# Patient Record
Sex: Male | Born: 1955 | Race: White | Hispanic: No | Smoking: Former smoker
Health system: Southern US, Community
[De-identification: ages and names within clinical notes are randomized; demographics above are authoritative.]

## PROBLEM LIST (undated history)

## (undated) DIAGNOSIS — I10 Essential (primary) hypertension: Secondary | ICD-10-CM

## (undated) DIAGNOSIS — N529 Male erectile dysfunction, unspecified: Secondary | ICD-10-CM

## (undated) DIAGNOSIS — G4730 Sleep apnea, unspecified: Secondary | ICD-10-CM

## (undated) DIAGNOSIS — F32A Depression, unspecified: Secondary | ICD-10-CM

## (undated) DIAGNOSIS — F419 Anxiety disorder, unspecified: Secondary | ICD-10-CM

## (undated) DIAGNOSIS — I499 Cardiac arrhythmia, unspecified: Secondary | ICD-10-CM

## (undated) DIAGNOSIS — F329 Major depressive disorder, single episode, unspecified: Secondary | ICD-10-CM

## (undated) HISTORY — PX: NASAL SEPTUM SURGERY: SHX37

## (undated) HISTORY — PX: ATRIAL FIBRILLATION ABLATION: EP1191

---

## 2010-06-21 ENCOUNTER — Emergency Department: Payer: Self-pay | Admitting: Emergency Medicine

## 2013-11-22 ENCOUNTER — Ambulatory Visit: Payer: Self-pay | Admitting: Unknown Physician Specialty

## 2014-03-07 ENCOUNTER — Ambulatory Visit: Payer: Self-pay | Admitting: Internal Medicine

## 2014-03-28 ENCOUNTER — Ambulatory Visit: Payer: Self-pay | Admitting: Internal Medicine

## 2014-06-04 ENCOUNTER — Ambulatory Visit: Payer: Self-pay | Admitting: Internal Medicine

## 2015-08-22 IMAGING — CR ORBITS FOR FOREIGN BODY - 2 VIEW
1 series · 2 of 2 positions shown · non-contrast
Comparison: None.

CLINICAL DATA: Metal working/exposure; clearance prior to MRI

EXAM:
ORBITS FOR FOREIGN BODY - 2 VIEW

[Series 1: w waters pa · 0.14mm/px · 2 of 2 slices shown]
[im 1/2]
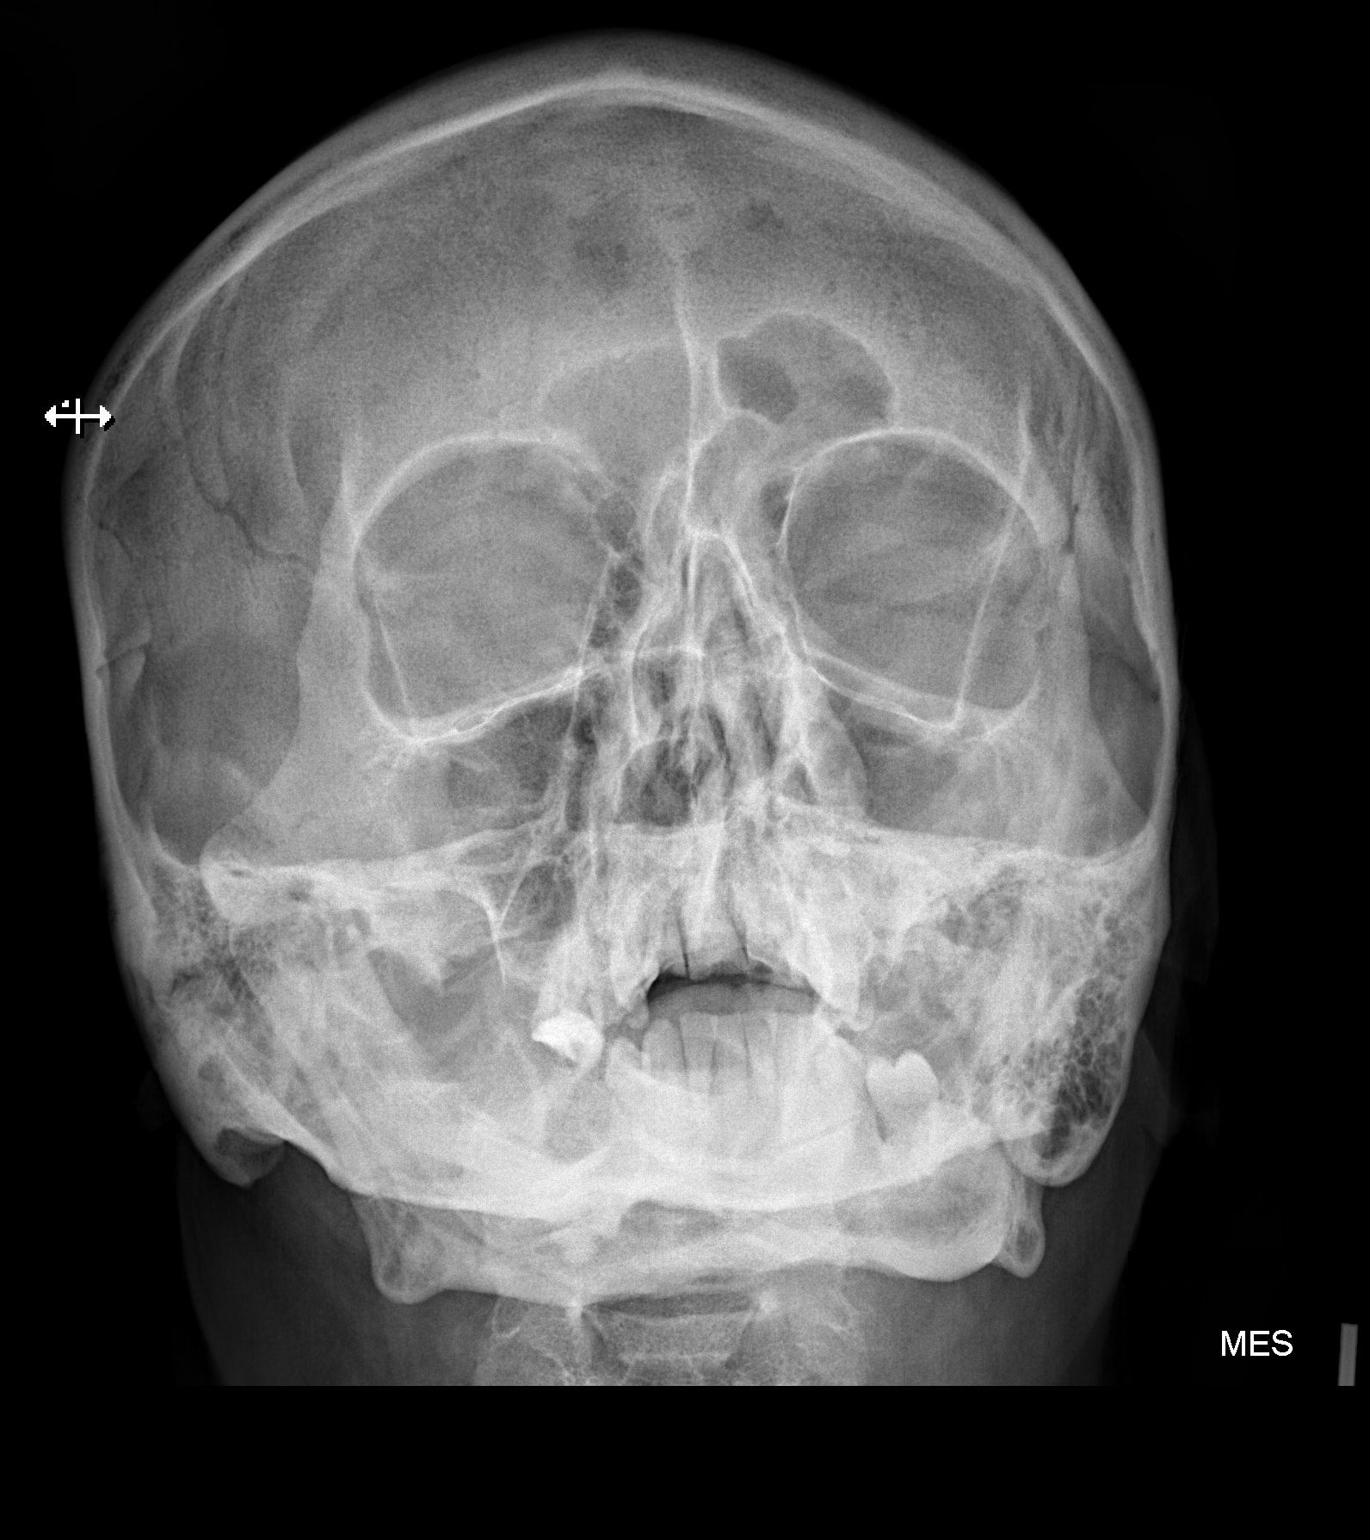
[im 2/2]
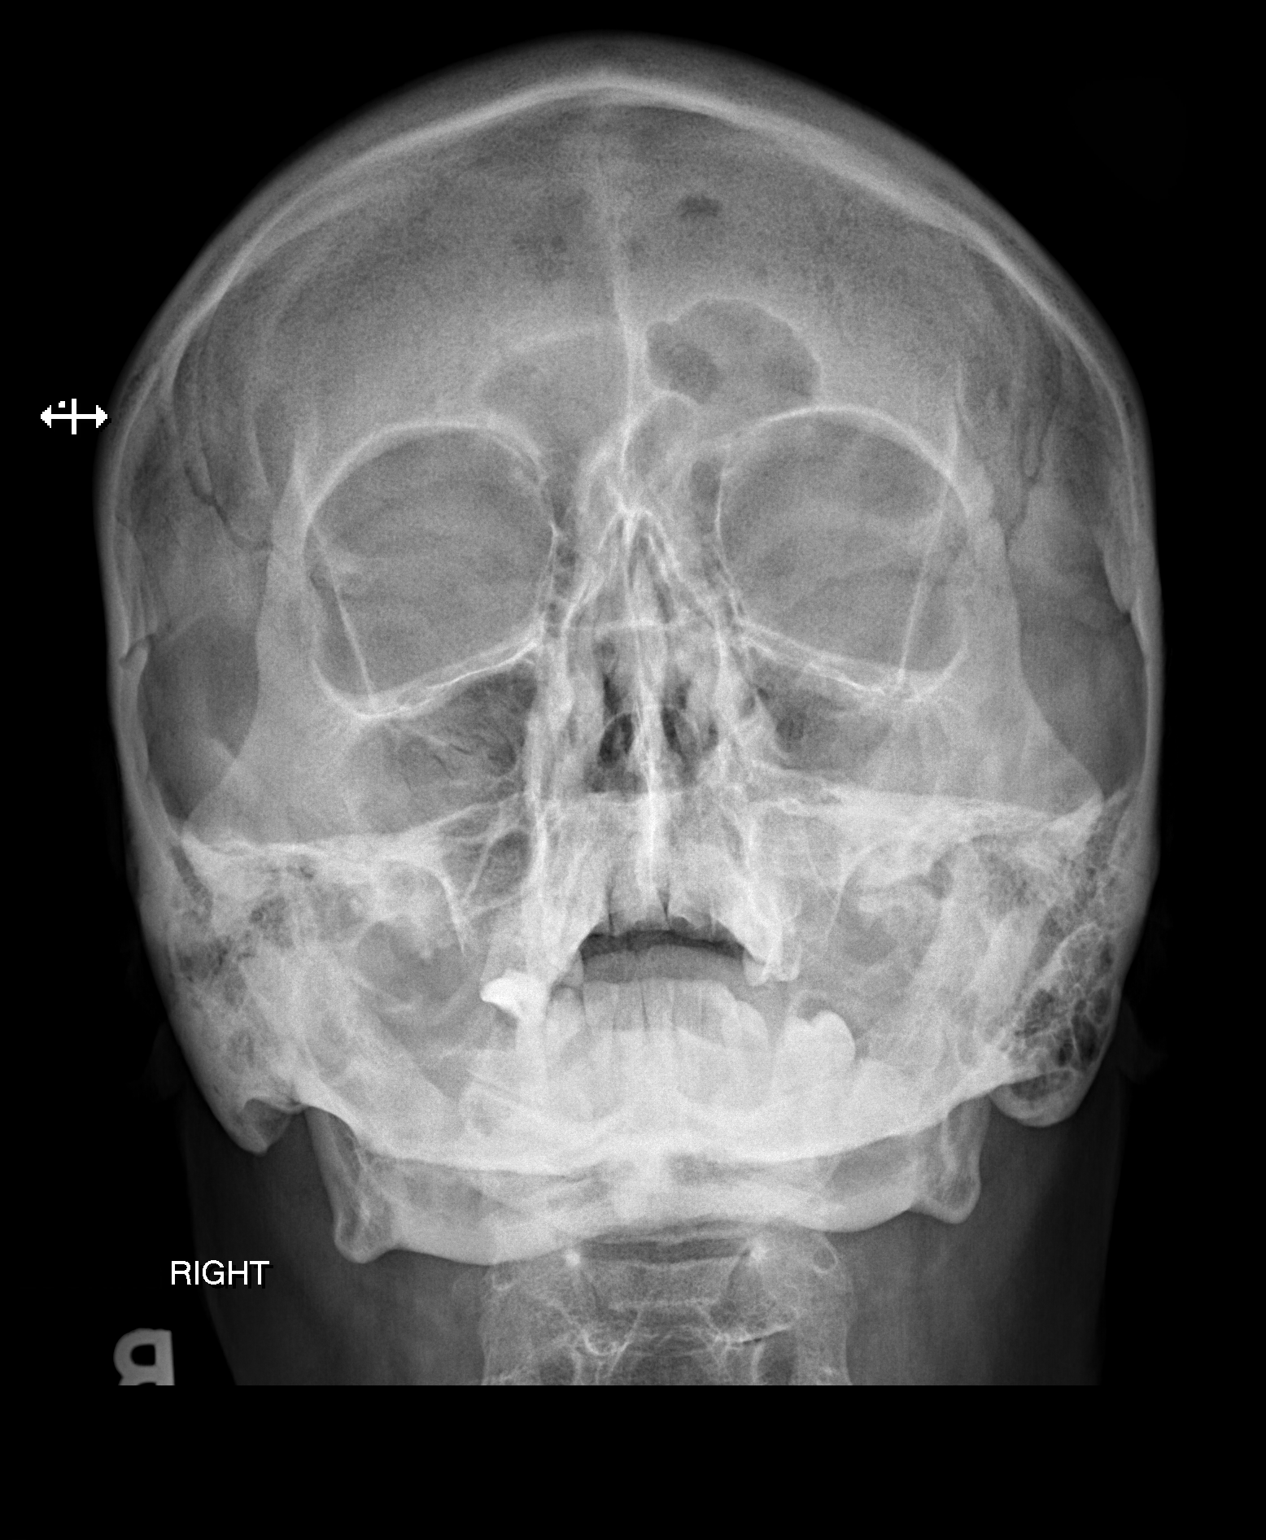

[2 of 2 positions shown; findings below may reference images not displayed]

FINDINGS: There is no evidence of metallic foreign body within the orbits. No
significant bone abnormality identified.
IMPRESSION: No evidence of metallic foreign body within the orbits.

## 2015-12-09 DIAGNOSIS — Z6830 Body mass index (BMI) 30.0-30.9, adult: Secondary | ICD-10-CM | POA: Diagnosis not present

## 2015-12-09 DIAGNOSIS — M722 Plantar fascial fibromatosis: Secondary | ICD-10-CM | POA: Diagnosis not present

## 2016-02-09 DIAGNOSIS — Z1389 Encounter for screening for other disorder: Secondary | ICD-10-CM | POA: Diagnosis not present

## 2016-02-09 DIAGNOSIS — I10 Essential (primary) hypertension: Secondary | ICD-10-CM | POA: Diagnosis not present

## 2016-02-09 DIAGNOSIS — Z79899 Other long term (current) drug therapy: Secondary | ICD-10-CM | POA: Diagnosis not present

## 2016-02-09 DIAGNOSIS — M722 Plantar fascial fibromatosis: Secondary | ICD-10-CM | POA: Diagnosis not present

## 2016-02-09 DIAGNOSIS — I4891 Unspecified atrial fibrillation: Secondary | ICD-10-CM | POA: Diagnosis not present

## 2016-02-09 DIAGNOSIS — F418 Other specified anxiety disorders: Secondary | ICD-10-CM | POA: Diagnosis not present

## 2016-05-17 DIAGNOSIS — G4733 Obstructive sleep apnea (adult) (pediatric): Secondary | ICD-10-CM | POA: Diagnosis not present

## 2016-05-17 DIAGNOSIS — I480 Paroxysmal atrial fibrillation: Secondary | ICD-10-CM | POA: Diagnosis not present

## 2016-05-17 DIAGNOSIS — I10 Essential (primary) hypertension: Secondary | ICD-10-CM | POA: Diagnosis not present

## 2016-07-01 DIAGNOSIS — H5213 Myopia, bilateral: Secondary | ICD-10-CM | POA: Diagnosis not present

## 2016-07-11 DIAGNOSIS — N529 Male erectile dysfunction, unspecified: Secondary | ICD-10-CM | POA: Diagnosis not present

## 2016-07-11 DIAGNOSIS — E669 Obesity, unspecified: Secondary | ICD-10-CM | POA: Diagnosis not present

## 2016-08-11 DIAGNOSIS — I10 Essential (primary) hypertension: Secondary | ICD-10-CM | POA: Diagnosis not present

## 2016-08-11 DIAGNOSIS — Z1211 Encounter for screening for malignant neoplasm of colon: Secondary | ICD-10-CM | POA: Diagnosis not present

## 2016-08-11 DIAGNOSIS — Z0000 Encounter for general adult medical examination without abnormal findings: Secondary | ICD-10-CM | POA: Diagnosis not present

## 2016-08-11 DIAGNOSIS — I4891 Unspecified atrial fibrillation: Secondary | ICD-10-CM | POA: Diagnosis not present

## 2016-10-10 DIAGNOSIS — Z1211 Encounter for screening for malignant neoplasm of colon: Secondary | ICD-10-CM | POA: Diagnosis not present

## 2017-01-26 ENCOUNTER — Encounter: Payer: Self-pay | Admitting: *Deleted

## 2017-01-27 ENCOUNTER — Ambulatory Visit: Payer: BLUE CROSS/BLUE SHIELD | Admitting: Anesthesiology

## 2017-01-27 ENCOUNTER — Ambulatory Visit
Admission: RE | Admit: 2017-01-27 | Discharge: 2017-01-27 | Disposition: A | Discharge status: Home / Self Care | Payer: BLUE CROSS/BLUE SHIELD | Source: Ambulatory Visit | Attending: Unknown Physician Specialty | Admitting: Unknown Physician Specialty

## 2017-01-27 ENCOUNTER — Encounter
Admission: RE | Disposition: A | Discharge status: Home / Self Care | Payer: Self-pay | Source: Ambulatory Visit | Attending: Unknown Physician Specialty

## 2017-01-27 DIAGNOSIS — G4730 Sleep apnea, unspecified: Secondary | ICD-10-CM | POA: Insufficient documentation

## 2017-01-27 DIAGNOSIS — K5790 Diverticulosis of intestine, part unspecified, without perforation or abscess without bleeding: Secondary | ICD-10-CM | POA: Diagnosis not present

## 2017-01-27 DIAGNOSIS — Z87891 Personal history of nicotine dependence: Secondary | ICD-10-CM | POA: Diagnosis not present

## 2017-01-27 DIAGNOSIS — K640 First degree hemorrhoids: Secondary | ICD-10-CM | POA: Insufficient documentation

## 2017-01-27 DIAGNOSIS — I10 Essential (primary) hypertension: Secondary | ICD-10-CM | POA: Insufficient documentation

## 2017-01-27 DIAGNOSIS — K635 Polyp of colon: Secondary | ICD-10-CM | POA: Diagnosis not present

## 2017-01-27 DIAGNOSIS — Z79899 Other long term (current) drug therapy: Secondary | ICD-10-CM | POA: Diagnosis not present

## 2017-01-27 DIAGNOSIS — F329 Major depressive disorder, single episode, unspecified: Secondary | ICD-10-CM | POA: Diagnosis not present

## 2017-01-27 DIAGNOSIS — F419 Anxiety disorder, unspecified: Secondary | ICD-10-CM | POA: Insufficient documentation

## 2017-01-27 DIAGNOSIS — D120 Benign neoplasm of cecum: Secondary | ICD-10-CM | POA: Diagnosis not present

## 2017-01-27 DIAGNOSIS — K5140 Inflammatory polyps of colon without complications: Secondary | ICD-10-CM | POA: Diagnosis not present

## 2017-01-27 DIAGNOSIS — Z1211 Encounter for screening for malignant neoplasm of colon: Secondary | ICD-10-CM | POA: Insufficient documentation

## 2017-01-27 DIAGNOSIS — K648 Other hemorrhoids: Secondary | ICD-10-CM | POA: Diagnosis not present

## 2017-01-27 DIAGNOSIS — Z7982 Long term (current) use of aspirin: Secondary | ICD-10-CM | POA: Diagnosis not present

## 2017-01-27 DIAGNOSIS — D122 Benign neoplasm of ascending colon: Secondary | ICD-10-CM | POA: Insufficient documentation

## 2017-01-27 DIAGNOSIS — K5730 Diverticulosis of large intestine without perforation or abscess without bleeding: Secondary | ICD-10-CM | POA: Insufficient documentation

## 2017-01-27 HISTORY — DX: Essential (primary) hypertension: I10

## 2017-01-27 HISTORY — DX: Major depressive disorder, single episode, unspecified: F32.9

## 2017-01-27 HISTORY — PX: COLONOSCOPY WITH PROPOFOL: SHX5780

## 2017-01-27 HISTORY — DX: Sleep apnea, unspecified: G47.30

## 2017-01-27 HISTORY — DX: Depression, unspecified: F32.A

## 2017-01-27 HISTORY — DX: Cardiac arrhythmia, unspecified: I49.9

## 2017-01-27 HISTORY — DX: Male erectile dysfunction, unspecified: N52.9

## 2017-01-27 HISTORY — DX: Anxiety disorder, unspecified: F41.9

## 2017-01-27 SURGERY — COLONOSCOPY WITH PROPOFOL
Anesthesia: General

## 2017-01-27 MED ORDER — MIDAZOLAM HCL 5 MG/5ML IJ SOLN
INTRAMUSCULAR | Status: DC | PRN
  Administered 2017-01-27: 2 mg via INTRAVENOUS

## 2017-01-27 MED ORDER — PROPOFOL 10 MG/ML IV BOLUS
INTRAVENOUS | Status: AC
  Filled 2017-01-27: qty 20

## 2017-01-27 MED ORDER — EPHEDRINE SULFATE 50 MG/ML IJ SOLN
INTRAMUSCULAR | Status: DC | PRN
  Administered 2017-01-27: 10 mg via INTRAVENOUS
  Administered 2017-01-27: 5 mg via INTRAVENOUS
  Administered 2017-01-27: 10 mg via INTRAVENOUS

## 2017-01-27 MED ORDER — PHENYLEPHRINE HCL 10 MG/ML IJ SOLN
INTRAMUSCULAR | Status: DC | PRN
  Administered 2017-01-27 (×6): 100 ug via INTRAVENOUS
  Administered 2017-01-27: 200 ug via INTRAVENOUS

## 2017-01-27 MED ORDER — SODIUM CHLORIDE 0.9 % IV SOLN: INTRAVENOUS | Status: DC

## 2017-01-27 MED ORDER — FENTANYL CITRATE (PF) 100 MCG/2ML IJ SOLN
INTRAMUSCULAR | Status: DC | PRN
  Administered 2017-01-27 (×2): 50 ug via INTRAVENOUS

## 2017-01-27 MED ORDER — SODIUM CHLORIDE 0.9 % IV SOLN
INTRAVENOUS | Status: DC
  Administered 2017-01-27: 09:00:00 via INTRAVENOUS

## 2017-01-27 MED ORDER — LIDOCAINE HCL (PF) 2 % IJ SOLN
INTRAMUSCULAR | Status: AC
  Filled 2017-01-27: qty 2

## 2017-01-27 MED ORDER — PROPOFOL 10 MG/ML IV BOLUS
INTRAVENOUS | Status: DC | PRN
  Administered 2017-01-27: 20 mg via INTRAVENOUS
  Administered 2017-01-27: 30 mg via INTRAVENOUS

## 2017-01-27 MED ORDER — FENTANYL CITRATE (PF) 100 MCG/2ML IJ SOLN
INTRAMUSCULAR | Status: AC
  Filled 2017-01-27: qty 2

## 2017-01-27 MED ORDER — PROPOFOL 500 MG/50ML IV EMUL
INTRAVENOUS | Status: DC | PRN
  Administered 2017-01-27: 75 ug/kg/min via INTRAVENOUS

## 2017-01-27 MED ORDER — MIDAZOLAM HCL 2 MG/2ML IJ SOLN
INTRAMUSCULAR | Status: AC
  Filled 2017-01-27: qty 2

## 2017-01-27 MED ORDER — LIDOCAINE HCL (PF) 2 % IJ SOLN
INTRAMUSCULAR | Status: DC | PRN
  Administered 2017-01-27: 50 mg

## 2017-01-27 NOTE — H&P (Signed)
   Primary Care Physician:  Cyndi Bender, PA-C Primary Gastroenterologist:  Dr. Vira Agar  Pre-Procedure History & Physical: HPI:  Glenn Ross is a 61 y.o. male is here for an colonoscopy.   Past Medical History:  Diagnosis Date  . Anxiety   . Depression   . Dysrhythmia   . Erectile dysfunction   . Hypertension   . Sleep apnea     Past Surgical History:  Procedure Laterality Date  . ATRIAL FIBRILLATION ABLATION    . NASAL SEPTUM SURGERY      Prior to Admission medications   Medication Sig Start Date End Date Taking? Authorizing Provider  aspirin EC 81 MG tablet Take 81 mg by mouth daily.   Yes [provider]  hydrochlorothiazide (HYDRODIURIL) 25 MG tablet Take 25 mg by mouth daily.   Yes [provider]  metoprolol succinate (TOPROL-XL) 100 MG 24 hr tablet Take 100 mg by mouth daily. Take with or immediately following a meal.   Yes [provider]  mirtazapine (REMERON) 30 MG tablet Take 30 mg by mouth at bedtime.   Yes [provider]  pyridoxine (B-6) 100 MG tablet Take 100 mg by mouth daily.   Yes [provider]  loratadine (CLARITIN) 10 MG tablet Take 10 mg by mouth daily.    [provider]  meclizine (ANTIVERT) 25 MG tablet Take 25 mg by mouth every 6 (six) hours as needed for dizziness.    [provider]    Allergies as of 10/14/2016  . (Not on File)    History reviewed. No pertinent family history.  Social History   Social History  . Marital status: Widowed    Spouse name: N/A  . Number of children: N/A  . Years of education: N/A   Occupational History  . Not on file.   Social History Main Topics  . Smoking status: Former Smoker    Packs/day: 2.00    Quit date: 06/21/2011  . Smokeless tobacco: Current User    Types: Snuff  . Alcohol use 3.6 - 7.2 oz/week    6 - 12 Cans of beer per week     Comment: 6-12 beers per week   . Drug use: No  . Sexual activity: Not on file   Other Topics  Concern  . Not on file   Social History Narrative  . No narrative on file    Review of Systems: See HPI, otherwise negative ROS  Physical Exam: BP 122/86   Pulse 70   Temp 98 F (36.7 C) (Tympanic)   Resp 16   Ht 6' (1.829 m)   Wt 108 kg (238 lb)   SpO2 98%   BMI 32.28 kg/m  General:   Alert,  pleasant and cooperative in NAD Head:  Normocephalic and atraumatic. Neck:  Supple; no masses or thyromegaly. Lungs:  Clear throughout to auscultation.    Heart:  Regular rate and rhythm. Abdomen:  Soft, nontender and nondistended. Normal bowel sounds, without guarding, and without rebound.   Neurologic:  Alert and  oriented x4;  grossly normal neurologically.  Impression/Plan: Glenn Ross is here for an colonoscopy to be performed for colon cancer screening  Risks, benefits, limitations, and alternatives regarding  colonoscopy have been reviewed with the patient.  Questions have been answered.  All parties agreeable.   Gaylyn Cheers, MD  01/27/2017, 9:53 AM

## 2017-01-27 NOTE — Op Note (Signed)
[facility] Patient Name: Glenn Ross Procedure Date: 01/27/2017 9:49 AM MRN: [id number] Account #: [id number] Date of Birth: [birth year] Admit Type: Outpatient Age: 61 Room: [facility] ENDO ROOM 1 Gender: Male Note Status: Finalized Procedure:            Colonoscopy Indications:          Screening for colorectal malignant neoplasm Providers:            Manya Silvas, MD Referring MD:         Cyndi Bender (Referring MD) Medicines:            Propofol per Anesthesia Complications:        No immediate complications. Procedure:            Pre-Anesthesia Assessment:                       - After reviewing the risks and benefits, the patient                        was deemed in satisfactory condition to undergo the                        procedure.                       After obtaining informed consent, the colonoscope was                        passed under direct vision. Throughout the procedure,                        the patient's blood pressure, pulse, and oxygen                        saturations were monitored continuously. The                        Colonoscope was introduced through the anus and                        advanced to the the cecum, identified by appendiceal                        orifice and ileocecal valve. The colonoscopy was                        somewhat difficult. The patient tolerated the procedure                        well. The quality of the bowel preparation was adequate                        to identify polyps 6 mm and larger in size. Findings:      Two sessile polyps were found in the cecum. The polyps were diminutive       in size. These polyps were removed with a jumbo cold forceps. Resection       and retrieval were complete.      A small polyp was found in the cecum. The polyp was sessile. The polyp       was removed with a hot  snare. Resection and retrieval were complete.      A diminutive polyp was found in  the proximal ascending colon. The polyp       was sessile. The polyp was removed with a jumbo cold forceps. Resection       and retrieval were complete.      A diminutive polyp was found in the cecum. The polyp was sessile. The       polyp was removed with a cold snare. Resection and retrieval were       complete.      Multiple small-mouthed diverticula were found in the sigmoid colon and       descending colon.      Internal hemorrhoids were found during endoscopy. The hemorrhoids were       small and Grade I (internal hemorrhoids that do not prolapse).      The exam was otherwise without abnormality. Impression:           - Two diminutive polyps in the cecum, removed with a                        jumbo cold forceps. Resected and retrieved.                       - One small polyp in the cecum, removed with a hot                        snare. Resected and retrieved.                       - One diminutive polyp in the proximal ascending colon,                        removed with a jumbo cold forceps. Resected and                        retrieved.                       - One diminutive polyp in the cecum, removed with a                        cold snare. Resected and retrieved.                       - Diverticulosis in the sigmoid colon and in the                        descending colon.                       - Internal hemorrhoids.                       - The examination was otherwise normal. Recommendation:       - Await pathology results. Manya Silvas, MD 01/27/2017 10:39:28 AM This report has been signed electronically. Number of Addenda: 0 Note Initiated On: 01/27/2017 9:49 AM Scope Withdrawal Time: 0 hours 29 minutes 1 second  Total Procedure Duration: 0 hours 35 minutes 27 seconds       [facility]

## 2017-01-27 NOTE — Anesthesia Post-op Follow-up Note (Signed)
Anesthesia QCDR form completed.        

## 2017-01-27 NOTE — Anesthesia Postprocedure Evaluation (Signed)
Anesthesia Post Note  Patient: Glenn Ross  Procedure(s) Performed: Procedure(s) (LRB): COLONOSCOPY WITH PROPOFOL (N/A)  Patient location during evaluation: PACU Anesthesia Type: General Level of consciousness: awake and alert and oriented Pain management: pain level controlled Vital Signs Assessment: post-procedure vital signs reviewed and stable Respiratory status: spontaneous breathing Cardiovascular status: blood pressure returned to baseline Anesthetic complications: no     Last Vitals:  Vitals:   01/27/17 1042 01/27/17 1052  BP: (!) 101/57 107/67  Pulse: 61 64  Resp: 17 13  Temp: (!) 36.1 C   SpO2: 99% 99%    Last Pain:  Vitals:   01/27/17 1042  TempSrc: Tympanic                 [name]

## 2017-01-27 NOTE — Anesthesia Preprocedure Evaluation (Signed)
Anesthesia Evaluation  Patient identified by MRN, date of birth, ID band Patient awake    Reviewed: Allergy & Precautions, NPO status , Patient's Chart, lab work & pertinent test results, reviewed documented beta blocker date and time   Airway Mallampati: III  TM Distance: <3 FB     Dental   Pulmonary sleep apnea , former smoker,    Pulmonary exam normal        Cardiovascular hypertension, Pt. on medications and Pt. on home beta blockers Normal cardiovascular exam+ dysrhythmias      Neuro/Psych PSYCHIATRIC DISORDERS Anxiety Depression    GI/Hepatic negative GI ROS, Neg liver ROS,   Endo/Other  negative endocrine ROS  Renal/GU negative Renal ROS     Musculoskeletal negative musculoskeletal ROS (+)   Abdominal Normal abdominal exam  (+)   Peds negative pediatric ROS (+)  Hematology negative hematology ROS (+)   Anesthesia Other Findings   Reproductive/Obstetrics                             Anesthesia Physical Anesthesia Plan  ASA: III  Anesthesia Plan: General   Post-op Pain Management:    Induction: Intravenous  PONV Risk Score and Plan:   Airway Management Planned: Nasal Cannula  Additional Equipment:   Intra-op Plan:   Post-operative Plan:   Informed Consent: I have reviewed the patients History and Physical, chart, labs and discussed the procedure including the risks, benefits and alternatives for the proposed anesthesia with the patient or authorized representative who has indicated his/her understanding and acceptance.   Dental advisory given  Plan Discussed with: CRNA and Surgeon  Anesthesia Plan Comments:         Anesthesia Quick Evaluation

## 2017-01-27 NOTE — Transfer of Care (Signed)
Immediate Anesthesia Transfer of Care Note  Patient: Glenn Ross  Procedure(s) Performed: Procedure(s): COLONOSCOPY WITH PROPOFOL (N/A)  Patient Location: PACU  Anesthesia Type:General  Level of Consciousness: sedated  Airway & Oxygen Therapy: Patient Spontanous Breathing and Patient connected to nasal cannula oxygen  Post-op Assessment: Report given to RN and Post -op Vital signs reviewed and stable  Post vital signs: Reviewed and stable  Last Vitals:  Vitals:   01/27/17 0909  BP: 122/86  Pulse: 70  Resp: 16  Temp: 36.7 C  SpO2: 98%    Last Pain:  Vitals:   01/27/17 0909  TempSrc: Tympanic         Complications: No apparent anesthesia complications

## 2017-01-30 ENCOUNTER — Encounter: Payer: Self-pay | Admitting: Unknown Physician Specialty

## 2017-01-30 LAB — SURGICAL PATHOLOGY

## 2017-02-08 DIAGNOSIS — I480 Paroxysmal atrial fibrillation: Secondary | ICD-10-CM | POA: Diagnosis not present

## 2017-02-08 DIAGNOSIS — G4733 Obstructive sleep apnea (adult) (pediatric): Secondary | ICD-10-CM | POA: Diagnosis not present

## 2017-02-14 DIAGNOSIS — I4891 Unspecified atrial fibrillation: Secondary | ICD-10-CM | POA: Diagnosis not present

## 2017-02-14 DIAGNOSIS — Z79899 Other long term (current) drug therapy: Secondary | ICD-10-CM | POA: Diagnosis not present

## 2017-02-14 DIAGNOSIS — I10 Essential (primary) hypertension: Secondary | ICD-10-CM | POA: Diagnosis not present

## 2017-02-14 DIAGNOSIS — G4733 Obstructive sleep apnea (adult) (pediatric): Secondary | ICD-10-CM | POA: Diagnosis not present

## 2017-02-14 DIAGNOSIS — Z1389 Encounter for screening for other disorder: Secondary | ICD-10-CM | POA: Diagnosis not present

## 2017-02-14 DIAGNOSIS — F418 Other specified anxiety disorders: Secondary | ICD-10-CM | POA: Diagnosis not present

## 2017-08-21 DIAGNOSIS — Z0000 Encounter for general adult medical examination without abnormal findings: Secondary | ICD-10-CM | POA: Diagnosis not present

## 2017-08-21 DIAGNOSIS — I4891 Unspecified atrial fibrillation: Secondary | ICD-10-CM | POA: Diagnosis not present

## 2017-08-21 DIAGNOSIS — Z1331 Encounter for screening for depression: Secondary | ICD-10-CM | POA: Diagnosis not present

## 2017-08-21 DIAGNOSIS — F418 Other specified anxiety disorders: Secondary | ICD-10-CM | POA: Diagnosis not present

## 2017-08-21 DIAGNOSIS — I10 Essential (primary) hypertension: Secondary | ICD-10-CM | POA: Diagnosis not present

## 2017-09-12 DIAGNOSIS — G4733 Obstructive sleep apnea (adult) (pediatric): Secondary | ICD-10-CM | POA: Diagnosis not present

## 2018-02-01 DIAGNOSIS — I480 Paroxysmal atrial fibrillation: Secondary | ICD-10-CM | POA: Diagnosis not present

## 2018-02-01 DIAGNOSIS — G4733 Obstructive sleep apnea (adult) (pediatric): Secondary | ICD-10-CM | POA: Diagnosis not present

## 2018-02-01 DIAGNOSIS — I340 Nonrheumatic mitral (valve) insufficiency: Secondary | ICD-10-CM | POA: Diagnosis not present

## 2018-02-01 DIAGNOSIS — I10 Essential (primary) hypertension: Secondary | ICD-10-CM | POA: Diagnosis not present

## 2018-02-21 DIAGNOSIS — I4891 Unspecified atrial fibrillation: Secondary | ICD-10-CM | POA: Diagnosis not present

## 2018-02-21 DIAGNOSIS — F418 Other specified anxiety disorders: Secondary | ICD-10-CM | POA: Diagnosis not present

## 2018-02-21 DIAGNOSIS — I10 Essential (primary) hypertension: Secondary | ICD-10-CM | POA: Diagnosis not present

## 2018-02-21 DIAGNOSIS — G4733 Obstructive sleep apnea (adult) (pediatric): Secondary | ICD-10-CM | POA: Diagnosis not present

## 2018-02-26 DIAGNOSIS — G4733 Obstructive sleep apnea (adult) (pediatric): Secondary | ICD-10-CM | POA: Diagnosis not present

## 2018-08-24 DIAGNOSIS — Z1331 Encounter for screening for depression: Secondary | ICD-10-CM | POA: Diagnosis not present

## 2018-08-24 DIAGNOSIS — Z0000 Encounter for general adult medical examination without abnormal findings: Secondary | ICD-10-CM | POA: Diagnosis not present

## 2018-08-24 DIAGNOSIS — F418 Other specified anxiety disorders: Secondary | ICD-10-CM | POA: Diagnosis not present

## 2018-08-24 DIAGNOSIS — I4891 Unspecified atrial fibrillation: Secondary | ICD-10-CM | POA: Diagnosis not present

## 2018-08-24 DIAGNOSIS — I10 Essential (primary) hypertension: Secondary | ICD-10-CM | POA: Diagnosis not present

## 2018-10-17 DIAGNOSIS — G4733 Obstructive sleep apnea (adult) (pediatric): Secondary | ICD-10-CM | POA: Diagnosis not present

## 2019-01-29 DIAGNOSIS — I340 Nonrheumatic mitral (valve) insufficiency: Secondary | ICD-10-CM | POA: Diagnosis not present

## 2019-01-29 DIAGNOSIS — G4733 Obstructive sleep apnea (adult) (pediatric): Secondary | ICD-10-CM | POA: Diagnosis not present

## 2019-01-29 DIAGNOSIS — I480 Paroxysmal atrial fibrillation: Secondary | ICD-10-CM | POA: Diagnosis not present

## 2019-01-29 DIAGNOSIS — I10 Essential (primary) hypertension: Secondary | ICD-10-CM | POA: Diagnosis not present

## 2019-02-27 DIAGNOSIS — G4733 Obstructive sleep apnea (adult) (pediatric): Secondary | ICD-10-CM | POA: Diagnosis not present

## 2019-02-27 DIAGNOSIS — I10 Essential (primary) hypertension: Secondary | ICD-10-CM | POA: Diagnosis not present

## 2019-02-27 DIAGNOSIS — Z23 Encounter for immunization: Secondary | ICD-10-CM | POA: Diagnosis not present

## 2019-02-27 DIAGNOSIS — I4891 Unspecified atrial fibrillation: Secondary | ICD-10-CM | POA: Diagnosis not present

## 2019-02-27 DIAGNOSIS — F418 Other specified anxiety disorders: Secondary | ICD-10-CM | POA: Diagnosis not present

## 2019-05-21 DIAGNOSIS — R6889 Other general symptoms and signs: Secondary | ICD-10-CM | POA: Diagnosis not present

## 2019-05-21 DIAGNOSIS — E669 Obesity, unspecified: Secondary | ICD-10-CM | POA: Diagnosis not present

## 2019-05-21 DIAGNOSIS — Z20828 Contact with and (suspected) exposure to other viral communicable diseases: Secondary | ICD-10-CM | POA: Diagnosis not present

## 2019-05-23 DIAGNOSIS — I10 Essential (primary) hypertension: Secondary | ICD-10-CM | POA: Diagnosis not present

## 2019-05-23 DIAGNOSIS — E669 Obesity, unspecified: Secondary | ICD-10-CM | POA: Diagnosis not present

## 2019-05-23 DIAGNOSIS — Z20828 Contact with and (suspected) exposure to other viral communicable diseases: Secondary | ICD-10-CM | POA: Diagnosis not present

## 2019-05-28 DIAGNOSIS — G4733 Obstructive sleep apnea (adult) (pediatric): Secondary | ICD-10-CM | POA: Diagnosis not present

## 2019-08-27 DIAGNOSIS — I4891 Unspecified atrial fibrillation: Secondary | ICD-10-CM | POA: Diagnosis not present

## 2019-08-27 DIAGNOSIS — Z1331 Encounter for screening for depression: Secondary | ICD-10-CM | POA: Diagnosis not present

## 2019-08-27 DIAGNOSIS — Z13220 Encounter for screening for lipoid disorders: Secondary | ICD-10-CM | POA: Diagnosis not present

## 2019-08-27 DIAGNOSIS — F418 Other specified anxiety disorders: Secondary | ICD-10-CM | POA: Diagnosis not present

## 2019-08-27 DIAGNOSIS — Z0000 Encounter for general adult medical examination without abnormal findings: Secondary | ICD-10-CM | POA: Diagnosis not present

## 2019-08-27 DIAGNOSIS — I10 Essential (primary) hypertension: Secondary | ICD-10-CM | POA: Diagnosis not present

## 2019-12-26 DIAGNOSIS — H524 Presbyopia: Secondary | ICD-10-CM | POA: Diagnosis not present

## 2020-02-17 DIAGNOSIS — I480 Paroxysmal atrial fibrillation: Secondary | ICD-10-CM | POA: Diagnosis not present

## 2020-02-17 DIAGNOSIS — G4733 Obstructive sleep apnea (adult) (pediatric): Secondary | ICD-10-CM | POA: Diagnosis not present

## 2020-02-17 DIAGNOSIS — I10 Essential (primary) hypertension: Secondary | ICD-10-CM | POA: Diagnosis not present

## 2020-03-25 DIAGNOSIS — I10 Essential (primary) hypertension: Secondary | ICD-10-CM | POA: Diagnosis not present

## 2020-03-25 DIAGNOSIS — Z23 Encounter for immunization: Secondary | ICD-10-CM | POA: Diagnosis not present

## 2020-05-02 DIAGNOSIS — Z20822 Contact with and (suspected) exposure to covid-19: Secondary | ICD-10-CM | POA: Diagnosis not present

## 2020-05-03 DIAGNOSIS — N390 Urinary tract infection, site not specified: Secondary | ICD-10-CM | POA: Diagnosis not present

## 2021-04-02 ENCOUNTER — Other Ambulatory Visit: Payer: Self-pay | Admitting: Physician Assistant

## 2021-04-02 DIAGNOSIS — Z136 Encounter for screening for cardiovascular disorders: Secondary | ICD-10-CM

## 2021-04-02 DIAGNOSIS — Z87891 Personal history of nicotine dependence: Secondary | ICD-10-CM

## 2021-09-29 ENCOUNTER — Encounter: Payer: Self-pay | Admitting: Gastroenterology

## 2021-09-29 NOTE — H&P (Signed)
? ?Pre-Procedure H&P ?  ?Patient ID: Glenn Ross is a 66 y.o. male. ? ?Gastroenterology Provider: Annamaria Helling, DO ? ?Referring Provider: Laurine Blazer, PA ?PCP: Cyndi Bender, PA-C ? ?Date: 09/30/2021 ? ?HPI ?Mr. Glenn Ross is a 66 y.o. male who presents today for Colonoscopy for Surveillance-personal history of colon polyps. ? ?Normal bowel movements without melena hematochezia constipation or diarrhea ? ?Family history of lymphoma-father ? ?EGD 2000 unremarkable ? ?Colonoscopy August 2018-multiple polyps-1 tubular adenoma ascending colon, 3 tubular adenomas cecum.  Sigmoid and descending colon diverticulosis and internal hemorrhoids ? ?Previous 2 pack a day smoker quit in 2013. Occasionally uses dip/chew now. ? ?History of A-fib not on anticoagulation.  Also with history of mitral insufficiency and sleep apnea ? ?Past Medical History:  ?Diagnosis Date  ? Anxiety   ? Depression   ? Dysrhythmia   ? Erectile dysfunction   ? Hypertension   ? Sleep apnea   ? ? ?Past Surgical History:  ?Procedure Laterality Date  ? ATRIAL FIBRILLATION ABLATION    ? COLONOSCOPY WITH PROPOFOL N/A 01/27/2017  ? Procedure: COLONOSCOPY WITH PROPOFOL;  Surgeon: Manya Silvas, MD;  Location: [facility] ENDOSCOPY;  Service: Endoscopy;  Laterality: N/A;  ? NASAL SEPTUM SURGERY    ? ? ?Family History ?No h/o GI disease or malignancy ?Father-lymphoma ? ?Review of Systems  ?Constitutional:  Negative for activity change, appetite change, chills, diaphoresis, fatigue, fever and unexpected weight change.  ?HENT:  Negative for trouble swallowing and voice change.   ?Respiratory:  Negative for shortness of breath and wheezing.   ?Cardiovascular:  Negative for chest pain, palpitations and leg swelling.  ?Gastrointestinal:  Negative for abdominal distention, abdominal pain, anal bleeding, blood in stool, constipation, diarrhea, nausea and vomiting.  ?Musculoskeletal:  Negative for arthralgias and myalgias.  ?Skin:  Negative for color change  and pallor.  ?Neurological:  Negative for dizziness, syncope and weakness.  ?Psychiatric/Behavioral:  Negative for confusion. The patient is not nervous/anxious.   ?All other systems reviewed and are negative.  ? ?Medications ?No current facility-administered medications on file prior to encounter.  ? ?Current Outpatient Medications on File Prior to Encounter  ?Medication Sig Dispense Refill  ? metoprolol succinate (TOPROL-XL) 100 MG 24 hr tablet Take 100 mg by mouth daily. Take with or immediately following a meal.    ? aspirin EC 81 MG tablet Take 81 mg by mouth daily.    ? hydrochlorothiazide (HYDRODIURIL) 25 MG tablet Take 25 mg by mouth daily.    ? loratadine (CLARITIN) 10 MG tablet Take 10 mg by mouth daily.    ? meclizine (ANTIVERT) 25 MG tablet Take 25 mg by mouth every 6 (six) hours as needed for dizziness.    ? mirtazapine (REMERON) 30 MG tablet Take 30 mg by mouth at bedtime.    ? pyridoxine (B-6) 100 MG tablet Take 100 mg by mouth daily.    ? ? ?Pertinent medications related to GI and procedure were reviewed by me with the patient prior to the procedure ? ? ?Current Facility-Administered Medications:  ?  0.9 %  sodium chloride infusion, , Intravenous, Continuous, Annamaria Helling, DO, Last Rate: 20 mL/hr at 09/30/21 1232, Continued from Pre-op at 09/30/21 1232 ?  ?  ? ?Not on File ?Allergies were reviewed by me prior to the procedure ? ?Objective  ? ?Body mass index is 29.7 kg/m?. ?Vitals:  ? 09/30/21 1220  ?BP: 134/83  ?Pulse: 82  ?Resp: 17  ?Temp: (!) 97.5 ?F (36.4 ?C)  ?  SpO2: 100%  ?Weight: 99.3 kg  ?Height: 6' (1.829 m)  ? ? ? ?Physical Exam ?Vitals and nursing note reviewed.  ?Constitutional:   ?   General: He is not in acute distress. ?   Appearance: Normal appearance. He is obese. He is not ill-appearing, toxic-appearing or diaphoretic.  ?HENT:  ?   Head: Normocephalic and atraumatic.  ?   Nose: Nose normal.  ?   Mouth/Throat:  ?   Mouth: Mucous membranes are moist.  ?   Pharynx: Oropharynx  is clear.  ?Eyes:  ?   General: No scleral icterus. ?   Extraocular Movements: Extraocular movements intact.  ?Cardiovascular:  ?   Rate and Rhythm: Normal rate and regular rhythm.  ?   Heart sounds: Normal heart sounds. No murmur heard. ?  No friction rub. No gallop.  ?Pulmonary:  ?   Effort: Pulmonary effort is normal. No respiratory distress.  ?   Breath sounds: Normal breath sounds. No wheezing, rhonchi or rales.  ?Abdominal:  ?   General: Bowel sounds are normal. There is no distension.  ?   Palpations: Abdomen is soft.  ?   Tenderness: There is no abdominal tenderness. There is no guarding or rebound.  ?Musculoskeletal:  ?   Cervical back: Neck supple.  ?   Right lower leg: No edema.  ?   Left lower leg: No edema.  ?Skin: ?   General: Skin is warm and dry.  ?   Coloration: Skin is not jaundiced or pale.  ?Neurological:  ?   General: No focal deficit present.  ?   Mental Status: He is alert and oriented to person, place, and time. Mental status is at baseline.  ?Psychiatric:     ?   Mood and Affect: Mood normal.     ?   Behavior: Behavior normal.     ?   Thought Content: Thought content normal.     ?   Judgment: Judgment normal.  ? ? ? ?Assessment:  ?Mr. Glenn Ross is a 66 y.o. male  who presents today for Colonoscopy for Surveillance-personal history of colon polyps. ? ?Plan:  ?Colonoscopy with possible intervention today ? ?Colonoscopy with possible biopsy, control of bleeding, polypectomy, and interventions as necessary has been discussed with the patient/patient representative. Informed consent was obtained from the patient/patient representative after explaining the indication, nature, and risks of the procedure including but not limited to death, bleeding, perforation, missed neoplasm/lesions, cardiorespiratory compromise, and reaction to medications. Opportunity for questions was given and appropriate answers were provided. Patient/patient representative has verbalized understanding is amenable to  undergoing the procedure. ? ? ?Annamaria Helling, DO  ?[facility] ? ?Portions of the record may have been created with voice recognition software. Occasional wrong-word or 'sound-a-like' substitutions may have occurred due to the inherent limitations of voice recognition software.  Read the chart carefully and recognize, using context, where substitutions may have occurred. ?

## 2021-09-30 ENCOUNTER — Encounter: Payer: Self-pay | Admitting: Gastroenterology

## 2021-09-30 ENCOUNTER — Ambulatory Visit
Admission: RE | Admit: 2021-09-30 | Discharge: 2021-09-30 | Disposition: A | Discharge status: Home / Self Care | Payer: Medicare Other | Attending: Gastroenterology | Admitting: Gastroenterology

## 2021-09-30 ENCOUNTER — Encounter
Admission: RE | Disposition: A | Discharge status: Home / Self Care | Payer: Self-pay | Source: Home / Self Care | Attending: Gastroenterology

## 2021-09-30 ENCOUNTER — Ambulatory Visit: Payer: Medicare Other | Admitting: Certified Registered Nurse Anesthetist

## 2021-09-30 DIAGNOSIS — F32A Depression, unspecified: Secondary | ICD-10-CM | POA: Diagnosis not present

## 2021-09-30 DIAGNOSIS — F419 Anxiety disorder, unspecified: Secondary | ICD-10-CM | POA: Insufficient documentation

## 2021-09-30 DIAGNOSIS — I10 Essential (primary) hypertension: Secondary | ICD-10-CM | POA: Insufficient documentation

## 2021-09-30 DIAGNOSIS — K5730 Diverticulosis of large intestine without perforation or abscess without bleeding: Secondary | ICD-10-CM | POA: Insufficient documentation

## 2021-09-30 DIAGNOSIS — G4730 Sleep apnea, unspecified: Secondary | ICD-10-CM | POA: Insufficient documentation

## 2021-09-30 DIAGNOSIS — I340 Nonrheumatic mitral (valve) insufficiency: Secondary | ICD-10-CM | POA: Insufficient documentation

## 2021-09-30 DIAGNOSIS — Z1211 Encounter for screening for malignant neoplasm of colon: Secondary | ICD-10-CM | POA: Insufficient documentation

## 2021-09-30 DIAGNOSIS — K621 Rectal polyp: Secondary | ICD-10-CM | POA: Diagnosis not present

## 2021-09-30 DIAGNOSIS — Z87891 Personal history of nicotine dependence: Secondary | ICD-10-CM | POA: Insufficient documentation

## 2021-09-30 DIAGNOSIS — K640 First degree hemorrhoids: Secondary | ICD-10-CM | POA: Diagnosis not present

## 2021-09-30 DIAGNOSIS — Z79899 Other long term (current) drug therapy: Secondary | ICD-10-CM | POA: Insufficient documentation

## 2021-09-30 HISTORY — PX: COLONOSCOPY WITH PROPOFOL: SHX5780

## 2021-09-30 SURGERY — COLONOSCOPY WITH PROPOFOL
Anesthesia: General

## 2021-09-30 MED ORDER — STERILE WATER FOR IRRIGATION IR SOLN
Status: DC | PRN
  Administered 2021-09-30: 60 mL

## 2021-09-30 MED ORDER — PROPOFOL 500 MG/50ML IV EMUL
INTRAVENOUS | Status: DC | PRN
  Administered 2021-09-30: 160 ug/kg/min via INTRAVENOUS

## 2021-09-30 MED ORDER — PROPOFOL 10 MG/ML IV BOLUS
INTRAVENOUS | Status: DC | PRN
  Administered 2021-09-30: 70 mg via INTRAVENOUS
  Administered 2021-09-30: 30 mg via INTRAVENOUS

## 2021-09-30 MED ORDER — SODIUM CHLORIDE 0.9 % IV SOLN: INTRAVENOUS | Status: DC

## 2021-09-30 NOTE — Interval H&P Note (Signed)
History and Physical Interval Note: Preprocedure H&P from 09/30/21 ? was reviewed and there was no interval change after seeing and examining the patient.  Written consent was obtained from the patient after discussion of risks, benefits, and alternatives. Patient has consented to proceed with Colonoscopy with possible intervention ? ? ?09/30/2021 ?12:33 PM ? ?Glenn Ross  has presented today for surgery, with the diagnosis of Z86.010  - Personal history of colonic polyps.  The various methods of treatment have been discussed with the patient and family. After consideration of risks, benefits and other options for treatment, the patient has consented to  Procedure(s): ?COLONOSCOPY WITH PROPOFOL (N/A) as a surgical intervention.  The patient's history has been reviewed, patient examined, no change in status, stable for surgery.  I have reviewed the patient's chart and labs.  Questions were answered to the patient's satisfaction.   ? ? ?Glenn Ross ? ? ?

## 2021-09-30 NOTE — Anesthesia Procedure Notes (Signed)
Date/Time: 09/30/2021 12:38 PM ?Performed by: Demetrius Charity, CRNA ?Pre-anesthesia Checklist: Patient identified, Emergency Drugs available, Suction available, Patient being monitored and Timeout performed ?Patient Re-evaluated:Patient Re-evaluated prior to induction ?Oxygen Delivery Method: Nasal cannula ?Induction Type: IV induction ?Placement Confirmation: CO2 detector and positive ETCO2 ? ? ? ? ?

## 2021-09-30 NOTE — Anesthesia Preprocedure Evaluation (Signed)
Anesthesia Evaluation  ?Patient identified by MRN, date of birth, ID band ?Patient awake ? ? ? ?Reviewed: ?Allergy & Precautions, H&P , NPO status , Patient's Chart, lab work & pertinent test results, reviewed documented beta blocker date and time  ? ?Airway ?Mallampati: II ? ? ?Neck ROM: full ? ? ? Dental ? ?(+) Poor Dentition ?  ?Pulmonary ?sleep apnea and Continuous Positive Airway Pressure Ventilation , former smoker,  ?  ?Pulmonary exam normal ? ? ? ? ? ? ? Cardiovascular ?Exercise Tolerance: Good ?hypertension, On Medications ?+ dysrhythmias Atrial Fibrillation  ?Rhythm:regular Rate:Normal ? ? ?  ?Neuro/Psych ?PSYCHIATRIC DISORDERS Anxiety Depression negative neurological ROS ?   ? GI/Hepatic ?negative GI ROS, Neg liver ROS,   ?Endo/Other  ?negative endocrine ROS ? Renal/GU ?negative Renal ROS  ?negative genitourinary ?  ?Musculoskeletal ? ? Abdominal ?  ?Peds ? Hematology ?negative hematology ROS ?(+)   ?Anesthesia Other Findings ?Past Medical History: ?No date: Anxiety ?No date: Depression ?No date: Dysrhythmia ?No date: Erectile dysfunction ?No date: Hypertension ?No date: Sleep apnea ?Past Surgical History: ?No date: ATRIAL FIBRILLATION ABLATION ?01/27/2017: COLONOSCOPY WITH PROPOFOL; N/A ?    Comment:  Procedure: COLONOSCOPY WITH PROPOFOL;  Surgeon: Vira Agar, ?             Gavin Pound, MD;  Location: [facility] ENDOSCOPY;  Service:  ?             Endoscopy;  Laterality: N/A; ?No date: NASAL SEPTUM SURGERY ?BMI   ? Body Mass Index: 29.70 kg/m?  ?  ? Reproductive/Obstetrics ?negative OB ROS ? ?  ? ? ? ? ? ? ? ? ? ? ? ? ? ?  ?  ? ? ? ? ? ? ? ? ?Anesthesia Physical ?Anesthesia Plan ? ?ASA: 3 ? ?Anesthesia Plan: General  ? ?Post-op Pain Management:   ? ?Induction:  ? ?PONV Risk Score and Plan:  ? ?Airway Management Planned:  ? ?Additional Equipment:  ? ?Intra-op Plan:  ? ?Post-operative Plan:  ? ?Informed Consent: I have reviewed the patients History and Physical, chart, labs and  discussed the procedure including the risks, benefits and alternatives for the proposed anesthesia with the patient or authorized representative who has indicated his/her understanding and acceptance.  ? ? ? ?Dental Advisory Given ? ?Plan Discussed with: CRNA ? ?Anesthesia Plan Comments:   ? ? ? ? ? ? ?Anesthesia Quick Evaluation ? ?

## 2021-09-30 NOTE — Op Note (Signed)
[facility] ?Gastroenterology ?Patient Name: Glenn Ross ?Procedure Date: 09/30/2021 12:37 PM ?MRN: [id number] ?Account #: [id number] ?Date of Birth: 09/20/1955 ?Admit Type: Outpatient ?Age: 66 ?Room: [facility] ENDO ROOM 1 ?Gender: Male ?Note Status: Finalized ?Instrument Name: Colonoscope [id number] ?Procedure:             Colonoscopy ?Indications:           High risk colon cancer surveillance: Personal history  ?                       of colonic polyps ?Providers:             Annamaria Helling DO, DO ?Referring MD:          Cyndi Bender (Referring MD) ?Medicines:             Monitored Anesthesia Care ?Complications:         No immediate complications. Estimated blood loss:  ?                       Minimal. ?Procedure:             Pre-Anesthesia Assessment: ?                       - Prior to the procedure, a History and Physical was  ?                       performed, and patient medications and allergies were  ?                       reviewed. The patient is competent. The risks and  ?                       benefits of the procedure and the sedation options and  ?                       risks were discussed with the patient. All questions  ?                       were answered and informed consent was obtained.  ?                       Patient identification and proposed procedure were  ?                       verified by the physician, the nurse, the anesthetist  ?                       and the technician in the endoscopy suite. Mental  ?                       Status Examination: alert and oriented. Airway  ?                       Examination: normal oropharyngeal airway and neck  ?                       mobility. Respiratory Examination: clear to  ?  auscultation. CV Examination: RRR, no murmurs, no S3  ?                       or S4. Prophylactic Antibiotics: The patient does not  ?                       require prophylactic antibiotics. Prior  ?                        Anticoagulants: The patient has taken no previous  ?                       anticoagulant or antiplatelet agents. ASA Grade  ?                       Assessment: III - A patient with severe systemic  ?                       disease. After reviewing the risks and benefits, the  ?                       patient was deemed in satisfactory condition to  ?                       undergo the procedure. The anesthesia plan was to use  ?                       monitored anesthesia care (MAC). Immediately prior to  ?                       administration of medications, the patient was  ?                       re-assessed for adequacy to receive sedatives. The  ?                       heart rate, respiratory rate, oxygen saturations,  ?                       blood pressure, adequacy of pulmonary ventilation, and  ?                       response to care were monitored throughout the  ?                       procedure. The physical status of the patient was  ?                       re-assessed after the procedure. ?                       After obtaining informed consent, the colonoscope was  ?                       passed under direct vision. Throughout the procedure,  ?                       the patient's blood pressure, pulse, and oxygen  ?  saturations were monitored continuously. The  ?                       Colonoscope was introduced through the anus and  ?                       advanced to the the cecum, identified by appendiceal  ?                       orifice and ileocecal valve. The colonoscopy was  ?                       performed without difficulty. The patient tolerated  ?                       the procedure well. The quality of the bowel  ?                       preparation was evaluated using the BBPS [facility] Bowel  ?                       Preparation Scale) with scores of: Right Colon = 2  ?                       (minor amount of residual staining, small fragments of  ?                        stool and/or opaque liquid, but mucosa seen well),  ?                       Transverse Colon = 3 (entire mucosa seen well with no  ?                       residual staining, small fragments of stool or opaque  ?                       liquid) and Left Colon = 3 (entire mucosa seen well  ?                       with no residual staining, small fragments of stool or  ?                       opaque liquid). The total BBPS score equals 8. Overall  ?                       excellent prep. Large amount of stool staining in  ?                       cecum/proximal ascending limiting some visualization-  ?                       cannot rule out all small polyps despite lavage and  ?                       suction. ?Findings: ?     The perianal and digital rectal examinations were normal. Pertinent  ?     negatives include normal  sphincter tone. ?     A 2 to 3 mm polyp was found in the rectum. The polyp was sessile. The  ?     polyp was removed with a jumbo cold forceps. Resection and retrieval  ?     were complete. Estimated blood loss was minimal. ?     Multiple small-mouthed diverticula were found in the left colon.  ?     Estimated blood loss: none. ?     Non-bleeding internal hemorrhoids were found during retroflexion. The  ?     hemorrhoids were Grade I (internal hemorrhoids that do not prolapse).  ?     Estimated blood loss: none. ?     The exam was otherwise without abnormality on direct and retroflexion  ?     views. ?Impression:            - One 2 to 3 mm polyp in the rectum, removed with a  ?                       jumbo cold forceps. Resected and retrieved. ?                       - Diverticulosis in the left colon. ?                       - Non-bleeding internal hemorrhoids. ?                       - The examination was otherwise normal on direct and  ?                       retroflexion views. ?Recommendation:        - Discharge patient to home. ?                       - Resume previous diet. ?                        - No aspirin, ibuprofen, naproxen, or other  ?                       non-steroidal anti-inflammatory drugs for 5 days after  ?                       polyp removal. ?                       - Continue present medications. ?                       - Await pathology results. ?                       - Repeat colonoscopy for surveillance based on  ?                       pathology results. ?                       - Return to referring physician as previously  ?                       scheduled. ?                       -  The findings and recommendations were discussed with  ?                       the patient. ?Procedure Code(s):     --- Professional --- ?                       [phone], Colonoscopy, flexible; with biopsy, single or  ?                       multiple ?Diagnosis Code(s):     --- Professional --- ?                       Z86.010, Personal history of colonic polyps ?                       K62.1, Rectal polyp ?                       K64.0, First degree hemorrhoids ?                       K57.30, Diverticulosis of large intestine without  ?                       perforation or abscess without bleeding ?CPT copyright 2019 [employer]. All rights reserved. ?The codes documented in this report are preliminary and upon coder review may  ?be revised to meet current compliance requirements. ?Attending Participation: ?     I personally performed the entire procedure. ?Volney American, DO ?Annamaria Helling DO, DO ?09/30/2021 1:11:42 PM ?This report has been signed electronically. ?Number of Addenda: 0 ?Note Initiated On: 09/30/2021 12:37 PM ?Scope Withdrawal Time: 0 hours 14 minutes 49 seconds  ?Total Procedure Duration: 0 hours 21 minutes 4 seconds  ?Estimated Blood Loss:  Estimated blood loss was minimal. ?     [facility] ?

## 2021-09-30 NOTE — Transfer of Care (Signed)
Immediate Anesthesia Transfer of Care Note ? ?Patient: Glenn Ross ? ?Procedure(s) Performed: COLONOSCOPY WITH PROPOFOL ? ?Patient Location: PACU ? ?Anesthesia Type:General ? ?Level of Consciousness: drowsy ? ?Airway & Oxygen Therapy: Patient spontaneously breathing ? ?Post-op Assessment: Report given to RN and Post -op Vital signs reviewed and stable ? ?Post vital signs: Reviewed and stable ? ?Last Vitals:  ?Vitals Value Taken Time  ?BP 101/73 09/30/21 1309  ?Temp    ?Pulse 100 09/30/21 1310  ?Resp 13 09/30/21 1310  ?SpO2 99 % 09/30/21 1310  ?Vitals shown include unvalidated device data. ? ?Last Pain: There were no vitals filed for this visit.   ? ?  ? ?Complications: No notable events documented. ?

## 2021-09-30 NOTE — Anesthesia Postprocedure Evaluation (Signed)
Anesthesia Post Note ? ?Patient: Glenn Ross ? ?Procedure(s) Performed: COLONOSCOPY WITH PROPOFOL ? ?Patient location during evaluation: PACU ?Anesthesia Type: General ?Level of consciousness: awake and alert, oriented and patient cooperative ?Pain management: pain level controlled ?Vital Signs Assessment: post-procedure vital signs reviewed and stable ?Respiratory status: spontaneous breathing, nonlabored ventilation and respiratory function stable ?Cardiovascular status: blood pressure returned to baseline and stable ?Postop Assessment: adequate PO intake ?Anesthetic complications: no ? ? ?No notable events documented. ? ? ?Last Vitals:  ?Vitals:  ? 09/30/21 1220 09/30/21 1309  ?BP: 134/83 101/73  ?Pulse: 82   ?Resp: 17   ?Temp: (!) 36.4 ?C 36.4 ?C  ?SpO2: 100%   ?  ?Last Pain:  ?Vitals:  ? 09/30/21 1319  ?TempSrc:   ?PainSc: 0-No pain  ? ? ?  ?  ?  ?  ?  ?  ? ?Darrin Nipper ? ? ? ? ?

## 2021-10-01 ENCOUNTER — Encounter: Payer: Self-pay | Admitting: Gastroenterology

## 2021-10-01 LAB — SURGICAL PATHOLOGY

## 2021-10-18 DEATH — deceased
